# Patient Record
Sex: Male | Born: 1990 | Race: White | Hispanic: No | Marital: Single | Smoking: Never smoker
Health system: Southern US, Community
[De-identification: ages and names within clinical notes are randomized; demographics above are authoritative.]

---

## 2015-11-18 ENCOUNTER — Emergency Department (HOSPITAL_COMMUNITY): Payer: BLUE CROSS/BLUE SHIELD

## 2015-11-18 ENCOUNTER — Emergency Department (HOSPITAL_COMMUNITY)
Admission: EM | Admit: 2015-11-18 | Discharge: 2015-11-18 | Disposition: A | Discharge status: Home / Self Care | Payer: BLUE CROSS/BLUE SHIELD | Source: Home / Self Care

## 2015-11-18 ENCOUNTER — Encounter (HOSPITAL_COMMUNITY): Payer: Self-pay | Admitting: *Deleted

## 2015-11-18 ENCOUNTER — Emergency Department (HOSPITAL_COMMUNITY)
Admission: EM | Admit: 2015-11-18 | Discharge: 2015-11-19 | Disposition: A | Discharge status: Home / Self Care | Payer: BLUE CROSS/BLUE SHIELD | Attending: Emergency Medicine | Admitting: Emergency Medicine

## 2015-11-18 DIAGNOSIS — Z79899 Other long term (current) drug therapy: Secondary | ICD-10-CM | POA: Insufficient documentation

## 2015-11-18 DIAGNOSIS — R1011 Right upper quadrant pain: Secondary | ICD-10-CM | POA: Insufficient documentation

## 2015-11-18 DIAGNOSIS — R112 Nausea with vomiting, unspecified: Secondary | ICD-10-CM | POA: Diagnosis present

## 2015-11-18 DIAGNOSIS — R1013 Epigastric pain: Secondary | ICD-10-CM | POA: Insufficient documentation

## 2015-11-18 DIAGNOSIS — R1010 Upper abdominal pain, unspecified: Secondary | ICD-10-CM

## 2015-11-18 DIAGNOSIS — R109 Unspecified abdominal pain: Secondary | ICD-10-CM

## 2015-11-18 DIAGNOSIS — Z5321 Procedure and treatment not carried out due to patient leaving prior to being seen by health care provider: Secondary | ICD-10-CM

## 2015-11-18 LAB — CBC
HEMATOCRIT: 44.8 % (ref 39.0–52.0)
HEMOGLOBIN: 15.6 g/dL (ref 13.0–17.0)
MCH: 31.3 pg (ref 26.0–34.0)
MCHC: 34.8 g/dL (ref 30.0–36.0)
MCV: 89.8 fL (ref 78.0–100.0)
Platelets: 334 10*3/uL (ref 150–400)
RBC: 4.99 MIL/uL (ref 4.22–5.81)
RDW: 13.2 % (ref 11.5–15.5)
WBC: 14.4 10*3/uL — AB (ref 4.0–10.5)

## 2015-11-18 LAB — COMPREHENSIVE METABOLIC PANEL
ALT: 15 U/L — ABNORMAL LOW (ref 17–63)
ANION GAP: 14 (ref 5–15)
AST: 22 U/L (ref 15–41)
Albumin: 5.4 g/dL — ABNORMAL HIGH (ref 3.5–5.0)
Alkaline Phosphatase: 73 U/L (ref 38–126)
BILIRUBIN TOTAL: 1 mg/dL (ref 0.3–1.2)
BUN: 13 mg/dL (ref 6–20)
CHLORIDE: 103 mmol/L (ref 101–111)
CO2: 22 mmol/L (ref 22–32)
Calcium: 10.3 mg/dL (ref 8.9–10.3)
Creatinine, Ser: 1.25 mg/dL — ABNORMAL HIGH (ref 0.61–1.24)
Glucose, Bld: 113 mg/dL — ABNORMAL HIGH (ref 65–99)
POTASSIUM: 3.2 mmol/L — AB (ref 3.5–5.1)
Sodium: 139 mmol/L (ref 135–145)
TOTAL PROTEIN: 8.6 g/dL — AB (ref 6.5–8.1)

## 2015-11-18 LAB — LIPASE, BLOOD: LIPASE: 28 U/L (ref 11–51)

## 2015-11-18 MED ORDER — FENTANYL CITRATE (PF) 100 MCG/2ML IJ SOLN
50.0000 ug | Freq: Once | INTRAMUSCULAR | Status: AC
  Administered 2015-11-18: 50 ug via INTRAVENOUS
  Filled 2015-11-18: qty 2

## 2015-11-18 MED ORDER — ONDANSETRON HCL 4 MG/2ML IJ SOLN
4.0000 mg | Freq: Once | INTRAMUSCULAR | Status: AC
  Administered 2015-11-18: 4 mg via INTRAVENOUS
  Filled 2015-11-18: qty 2

## 2015-11-18 MED ORDER — IOPAMIDOL (ISOVUE-300) INJECTION 61%
100.0000 mL | Freq: Once | INTRAVENOUS | Status: AC | PRN
  Administered 2015-11-19: 100 mL via INTRAVENOUS

## 2015-11-18 MED ORDER — SODIUM CHLORIDE 0.9 % IV BOLUS (SEPSIS)
1000.0000 mL | Freq: Once | INTRAVENOUS | Status: AC
  Administered 2015-11-18: 1000 mL via INTRAVENOUS

## 2015-11-18 NOTE — ED Notes (Signed)
Pt aware that a urine sample is needed.  Pt given urinal and ask to ring call bell once he has provided a sample.

## 2015-11-18 NOTE — ED Provider Notes (Signed)
[facility]-EMERGENCY DEPT Provider Note   CSN: [id number] Arrival date & time: 11/18/15  2221  History   Chief Complaint Chief Complaint  Patient presents with  . Emesis  . Abdominal Pain  . Diarrhea    HPI  Jamie Maddox is an 25 y.o. male with history of drug abuse and seizures who presents to the ED for evaluation of abdominal pain, nausea, vomiting, and diarrhea. He states for the past week he has generally felt unwell. Over the last couple days reports he has developed diffuse abdominal pain, worst in his upper abdomen. He states since yesterday he has been constantly nauseated and throwing up. States he cannot tolerate anything PO. Endorses some associated watery diarrhea as well. Pt states he drinks socially and smokes marijuana daily. He states he most recently bought marijuana from a different distributor and is worried it was laced with something else. S/p appendectomy. Denies urinary symptoms. He is unsure if he has had a fever.  History reviewed. No pertinent past medical history.  There are no active problems to display for this patient.   History reviewed. No pertinent surgical history.     Home Medications    Prior to Admission medications   Medication Sig Start Date End Date Taking? Authorizing Provider  Lacosamide (VIMPAT) 150 MG TABS Take 1 tablet by mouth 2 (two) times daily.   Yes Historical Provider, MD  levETIRAcetam (KEPPRA) 500 MG tablet Take 500 mg by mouth 2 (two) times daily.   Yes Historical Provider, MD    Family History No family history on file.  Social History Social History  Substance Use Topics  . Smoking status: Never Smoker  . Smokeless tobacco: Never Used  . Alcohol use No     Allergies   Review of patient's allergies indicates no known allergies.   Review of Systems Review of Systems  All other systems reviewed and are negative.    Physical Exam Updated Vital Signs BP 134/75   Pulse (!) 54   Temp 98.6 F (37 C) (Oral)    Resp 18   Ht 5\' 7"  (1.702 m)   Wt 77.1 kg   SpO2 100%   BMI 26.63 kg/m   Physical Exam  Constitutional: He is oriented to person, place, and time.  Appears uncomfortable  HENT:  Right Ear: External ear normal.  Left Ear: External ear normal.  Nose: Nose normal.  Mouth/Throat: No oropharyngeal exudate.  MM dry  Eyes: Conjunctivae are normal.  Neck: Neck supple.  Cardiovascular: Normal rate, regular rhythm, normal heart sounds and intact distal pulses.   Pulmonary/Chest: Effort normal and breath sounds normal. No respiratory distress. He has no wheezes.  Abdominal: Soft. Bowel sounds are normal. He exhibits no distension. There is tenderness. There is no rebound and no guarding.  Upper abdomen diffusely tender, particularly RUQ and epigastrum, without rebound or guarding. Abdomen soft and nondistended  Musculoskeletal: He exhibits no edema.  Lymphadenopathy:    He has no cervical adenopathy.  Neurological: He is alert and oriented to person, place, and time. No cranial nerve deficit.  Skin: Skin is warm and dry. There is pallor.  Psychiatric: He has a normal mood and affect.  Nursing note and vitals reviewed.    ED Treatments / Results  Labs (all labs ordered are listed, but only abnormal results are displayed) Labs Reviewed  COMPREHENSIVE METABOLIC PANEL - Abnormal; Notable for the following:       Result Value   Potassium 3.2 (*)    Glucose,  Bld 113 (*)    Creatinine, Ser 1.25 (*)    Total Protein 8.6 (*)    Albumin 5.4 (*)    ALT 15 (*)    All other components within normal limits  CBC - Abnormal; Notable for the following:    WBC 14.4 (*)    All other components within normal limits  LIPASE, BLOOD  URINALYSIS, ROUTINE W REFLEX MICROSCOPIC (NOT [facility])  URINE RAPID DRUG SCREEN, HOSP PERFORMED    EKG  EKG Interpretation None       Radiology Ct Abdomen Pelvis W Contrast  Result Date: 11/19/2015 CLINICAL DATA:  Acute onset of generalized abdominal pain,  nausea, vomiting and diarrhea. Initial encounter. EXAM: CT ABDOMEN AND PELVIS WITH CONTRAST TECHNIQUE: Multidetector CT imaging of the abdomen and pelvis was performed using the standard protocol following bolus administration of intravenous contrast. CONTRAST:  100mL ISOVUE-300 IOPAMIDOL (ISOVUE-300) INJECTION 61% COMPARISON:  None. FINDINGS: Lower chest: The visualized lung bases are grossly clear. The visualized portions of the mediastinum are grossly unremarkable. Hepatobiliary: The liver is unremarkable in appearance. The gallbladder is unremarkable in appearance. The common bile duct is normal in caliber. Pancreas: The pancreas is unremarkable in appearance. Spleen: The spleen is within normal limits. Adrenals/Urinary Tract: The adrenal glands are unremarkable in appearance. The kidneys are within normal limits. There is no evidence of hydronephrosis. No renal or ureteral stones are identified. No perinephric stranding is seen. Stomach/Bowel: The stomach is unremarkable in appearance. The small bowel is within normal limits. The patient is status post appendectomy. The colon is decompressed and is unremarkable in appearance. Vascular/Lymphatic: The abdominal aorta and its branches appear patent. No calcific atherosclerotic disease is seen. The inferior vena cava is unremarkable in appearance. No retroperitoneal lymphadenopathy is seen. No pelvic sidewall lymphadenopathy is appreciated. Reproductive: The bladder is mildly distended and grossly unremarkable. The prostate remains normal in size. Other: No significant soft tissue abnormalities are characterized. Musculoskeletal: No acute osseous abnormalities are seen. IMPRESSION: No acute abnormality seen within the abdomen or pelvis. Electronically Signed   By: Roanna RaiderJeffery  Chang M.D.   On: 11/19/2015 00:19    Procedures Procedures (including critical care time)  Medications Ordered in ED Medications  metoCLOPramide (REGLAN) injection 10 mg (not  administered)  sodium chloride 0.9 % bolus 1,000 mL (1,000 mLs Intravenous New Bag/Given 11/18/15 2315)  ondansetron (ZOFRAN) injection 4 mg (4 mg Intravenous Given 11/18/15 2315)  fentaNYL (SUBLIMAZE) injection 50 mcg (50 mcg Intravenous Given 11/18/15 2316)  iopamidol (ISOVUE-300) 61 % injection 100 mL (100 mLs Intravenous Contrast Given 11/19/15 0002)  promethazine (PHENERGAN) injection 25 mg (25 mg Intravenous Given 11/19/15 0021)  HYDROmorphone (DILAUDID) injection 1 mg (1 mg Intravenous Given 11/19/15 0022)     Initial Impression / Assessment and Plan / ED Course  I have reviewed the triage vital signs and the nursing notes.  Pertinent labs & imaging results that were available during my care of the patient were reviewed by me and considered in my medical decision making (see chart for details).  Clinical Course    Labs with mild hypokalemia which we will replete. Some AKI. Mild leukocytosis. CT abd/pelvis was obtained and is unremarkable. Pt feeling much improved after anti-emetics and pain meds. He is still mildly nauseated but tolerating PO. He does not want to wait for UDS/UA. Will send home with rx for anti-emetics. Discussed possibility of marijuana induced cyclical vomiting syndrome.   Final Clinical Impressions(s) / ED Diagnoses   Final diagnoses:  Non-intractable vomiting  with nausea, vomiting of unspecified type  Upper abdominal pain    New Prescriptions Discharge Medication List as of 11/19/2015 12:56 AM    START taking these medications   Details  dicyclomine (BENTYL) 20 MG tablet Take 1 tablet (20 mg total) by mouth 2 (two) times daily., Starting Thu 11/19/2015, Print    potassium chloride SA (K-DUR,KLOR-CON) 20 MEQ tablet Take 1 tablet (20 mEq total) by mouth 2 (two) times daily., Starting Thu 11/19/2015, Print    promethazine (PHENERGAN) 25 MG tablet Take 1 tablet (25 mg total) by mouth every 6 (six) hours as needed for nausea or vomiting., Starting Thu 11/19/2015, Print          Carlene Coria, PA-C 11/19/15 1020    Benjiman Core, MD 11/20/15 0005

## 2015-11-18 NOTE — ED Triage Notes (Signed)
Pt complains of abd pain/n/v/d. Pt states the vomiting became worse last night, diarrhea started 3 days ago.

## 2015-11-19 MED ORDER — HYDROMORPHONE HCL 1 MG/ML IJ SOLN
1.0000 mg | Freq: Once | INTRAMUSCULAR | Status: AC
  Administered 2015-11-19: 1 mg via INTRAVENOUS
  Filled 2015-11-19: qty 1

## 2015-11-19 MED ORDER — PROMETHAZINE HCL 25 MG PO TABS
25.0000 mg | ORAL_TABLET | Freq: Four times a day (QID) | ORAL | 0 refills | Status: AC | PRN
Start: 2015-11-19 — End: ?

## 2015-11-19 MED ORDER — METOCLOPRAMIDE HCL 5 MG/ML IJ SOLN
10.0000 mg | Freq: Once | INTRAMUSCULAR | Status: AC
  Administered 2015-11-19: 10 mg via INTRAVENOUS
  Filled 2015-11-19: qty 2

## 2015-11-19 MED ORDER — PROMETHAZINE HCL 25 MG/ML IJ SOLN
25.0000 mg | Freq: Once | INTRAMUSCULAR | Status: AC
  Administered 2015-11-19: 25 mg via INTRAVENOUS
  Filled 2015-11-19: qty 1

## 2015-11-19 MED ORDER — POTASSIUM CHLORIDE CRYS ER 20 MEQ PO TBCR: 20.0000 meq | EXTENDED_RELEASE_TABLET | Freq: Two times a day (BID) | ORAL | 0 refills | Status: AC

## 2015-11-19 MED ORDER — POTASSIUM CHLORIDE CRYS ER 20 MEQ PO TBCR
40.0000 meq | EXTENDED_RELEASE_TABLET | Freq: Once | ORAL | Status: AC
  Administered 2015-11-19: 40 meq via ORAL
  Filled 2015-11-19: qty 2

## 2015-11-19 MED ORDER — DICYCLOMINE HCL 20 MG PO TABS: 20.0000 mg | ORAL_TABLET | Freq: Two times a day (BID) | ORAL | 0 refills | Status: AC

## 2015-11-19 NOTE — Discharge Instructions (Signed)
Take phenergan as prescribed as needed for nausea/vomiting. Take Bentyl as needed for abdominal pain. Drink plenty of fluids to stay hydrated. Your potassium was low today. Take supplements as prescribed.

## 2015-11-19 NOTE — ED Notes (Signed)
Pt was given ginger ale for po challenge. 

## 2015-11-19 NOTE — ED Notes (Signed)
Pt has taken a few sips of ginger ale---- tolerated well; pt denies nausea at this time.

## 2017-10-28 IMAGING — CT CT ABD-PELV W/ CM
2 of 4 series · 16 of 46 positions shown, 18 images · IV contrast (ISOVUE)
Comparison: None.

CLINICAL DATA: Acute onset of generalized abdominal pain, nausea,
vomiting and diarrhea. Initial encounter.

EXAM:
CT ABDOMEN AND PELVIS WITH CONTRAST
TECHNIQUE: Multidetector CT imaging of the abdomen and pelvis was performed
using the standard protocol following bolus administration of
intravenous contrast.
CONTRAST:  100mL TNHVTD-E88 IOPAMIDOL (TNHVTD-E88) INJECTION 61%

[Series 2: abd/pel with · axial · 0.74mm/px · z∈[-605,-185]mm · 13 of 96 slices shown, 15 images]
[im 6/96  soft-tissue]
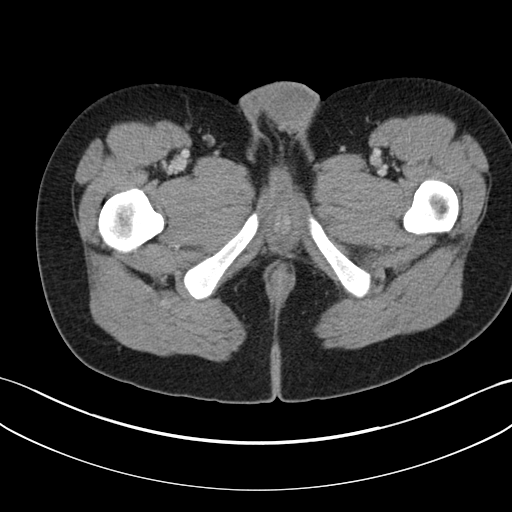
[im 6/96  bone]
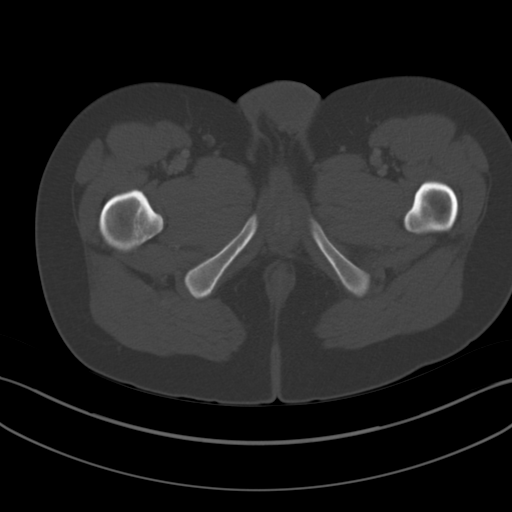
[im 11/96  soft-tissue]
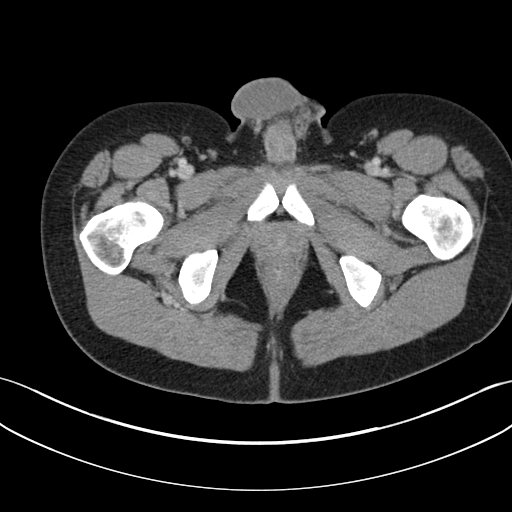
[im 22/96  soft-tissue]
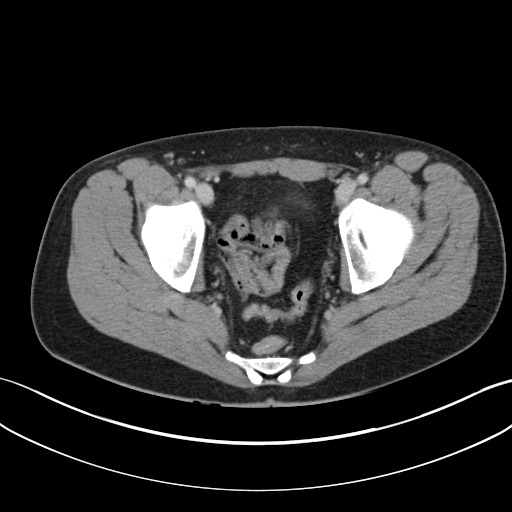
[im 27/96  soft-tissue]
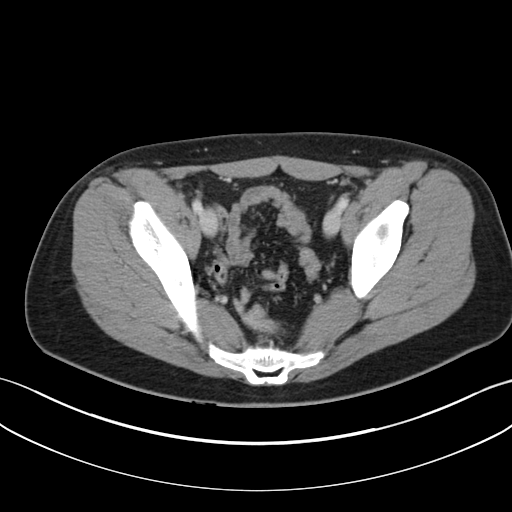
[im 32/96  soft-tissue]
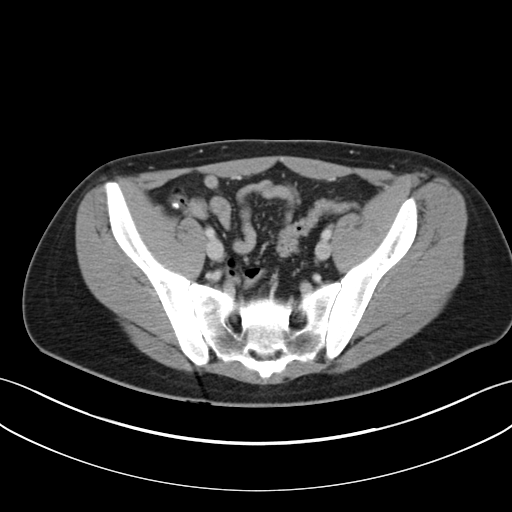
[im 43/96  soft-tissue]
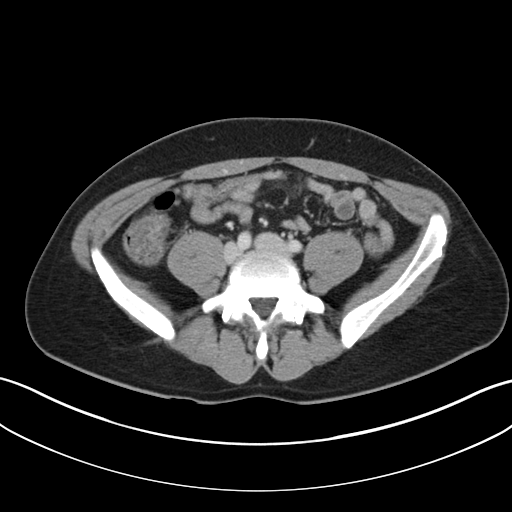
[im 48/96  soft-tissue]
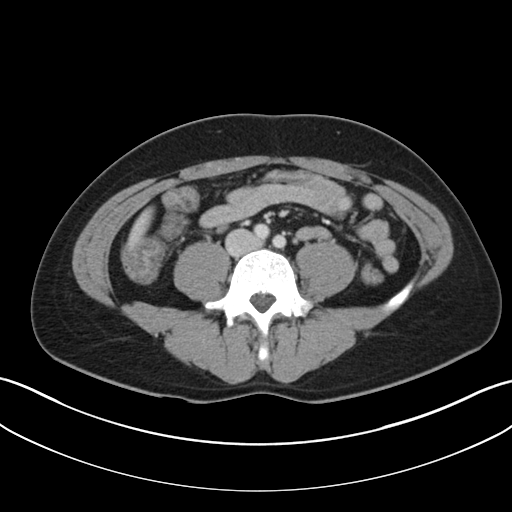
[im 53/96  soft-tissue]
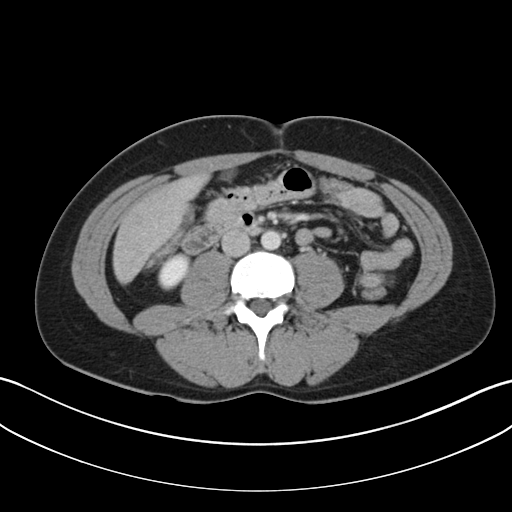
[im 64/96  soft-tissue]
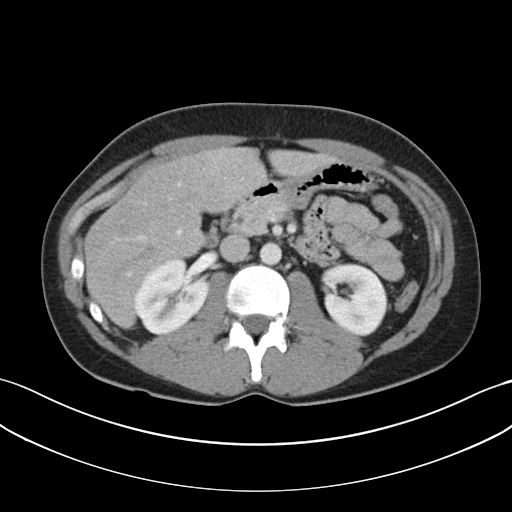
[im 64/96  bone]
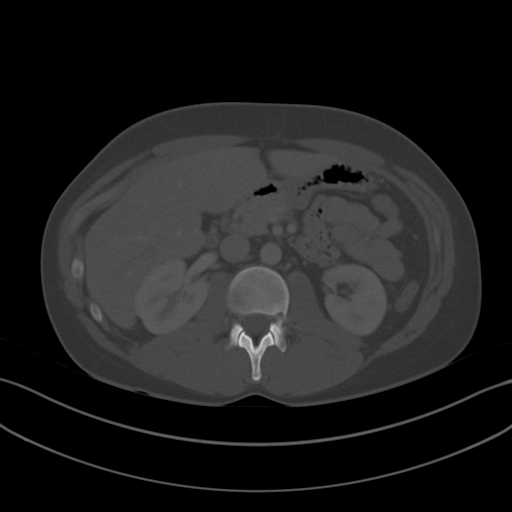
[im 69/96  soft-tissue]
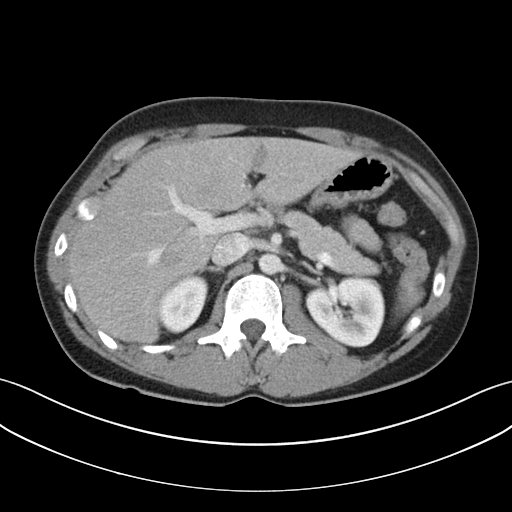
[im 74/96  soft-tissue]
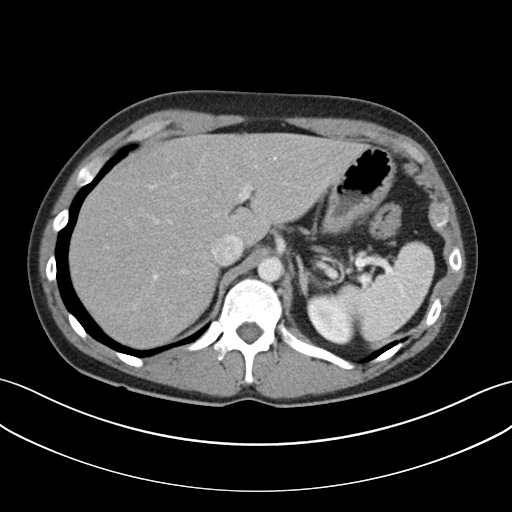
[im 85/96  soft-tissue]
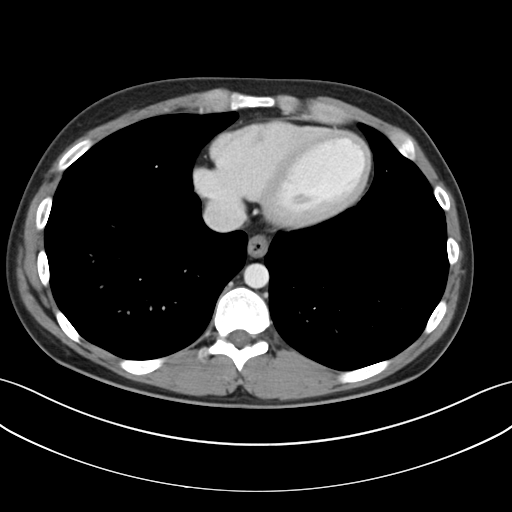
[im 90/96  soft-tissue]
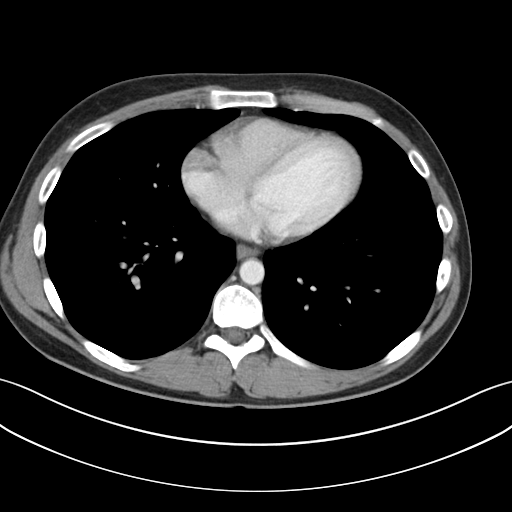

[Series 5: coronal a/|p · coronal · 0.74mm/px · 3 of 123 slices shown]
[im 41/123  soft-tissue]
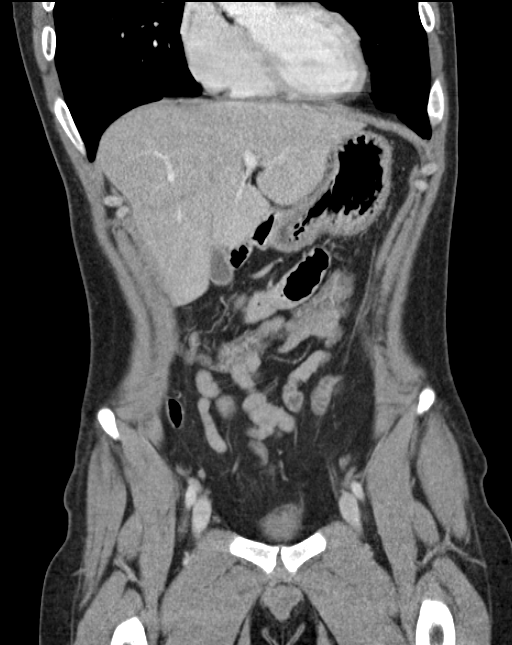
[im 55/123  soft-tissue]
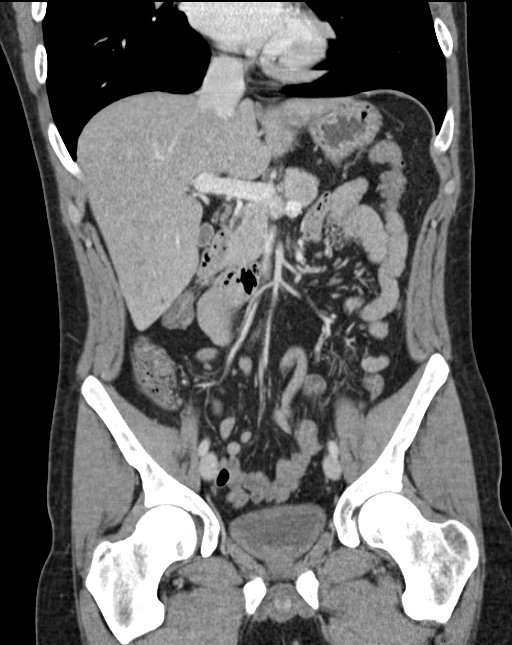
[im 68/123  soft-tissue]
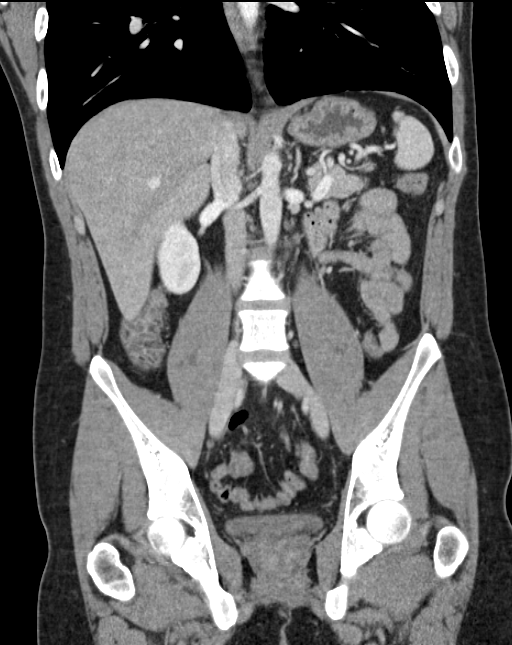

[16 of 46 positions shown; findings below may reference images not displayed]

FINDINGS: Lower chest: The visualized lung bases are grossly clear. The
visualized portions of the mediastinum are grossly unremarkable.

Hepatobiliary: The liver is unremarkable in appearance. The
gallbladder is unremarkable in appearance. The common bile duct is
normal in caliber.

Pancreas: The pancreas is unremarkable in appearance.

Spleen: The spleen is within normal limits.

Adrenals/Urinary Tract: The adrenal glands are unremarkable in
appearance. The kidneys are within normal limits. There is no
evidence of hydronephrosis. No renal or ureteral stones are
identified. No perinephric stranding is seen.

Stomach/Bowel: The stomach is unremarkable in appearance. The small
bowel is within normal limits. The patient is status post
appendectomy. The colon is decompressed and is unremarkable in
appearance.

Vascular/Lymphatic: The abdominal aorta and its branches appear
patent. No calcific atherosclerotic disease is seen. The inferior
vena cava is unremarkable in appearance. No retroperitoneal
lymphadenopathy is seen. No pelvic sidewall lymphadenopathy is
appreciated.

Reproductive: The bladder is mildly distended and grossly
unremarkable. The prostate remains normal in size.

Other: No significant soft tissue abnormalities are characterized.

Musculoskeletal: No acute osseous abnormalities are seen.
IMPRESSION: No acute abnormality seen within the abdomen or pelvis.
